# Patient Record
Sex: Male | Born: 1980 | Race: White | Hispanic: No | Marital: Single | Smoking: Never smoker
Health system: Southern US, Community
[De-identification: ages and names within clinical notes are randomized; demographics above are authoritative.]

## PROBLEM LIST (undated history)

## (undated) DIAGNOSIS — J3089 Other allergic rhinitis: Secondary | ICD-10-CM

## (undated) DIAGNOSIS — K219 Gastro-esophageal reflux disease without esophagitis: Secondary | ICD-10-CM

---

## 2014-11-20 ENCOUNTER — Emergency Department: Payer: Self-pay | Admitting: Emergency Medicine

## 2014-11-20 LAB — D-DIMER(ARMC): D-Dimer: 713 ng/ml

## 2015-09-18 ENCOUNTER — Ambulatory Visit: Payer: BLUE CROSS/BLUE SHIELD | Attending: Specialist

## 2015-09-18 DIAGNOSIS — G4733 Obstructive sleep apnea (adult) (pediatric): Secondary | ICD-10-CM | POA: Insufficient documentation

## 2017-09-15 ENCOUNTER — Ambulatory Visit
Admission: EM | Admit: 2017-09-15 | Discharge: 2017-09-15 | Disposition: A | Discharge status: Home / Self Care | Payer: BLUE CROSS/BLUE SHIELD | Attending: Family Medicine | Admitting: Family Medicine

## 2017-09-15 DIAGNOSIS — R51 Headache: Secondary | ICD-10-CM | POA: Diagnosis not present

## 2017-09-15 DIAGNOSIS — R05 Cough: Secondary | ICD-10-CM

## 2017-09-15 DIAGNOSIS — R6889 Other general symptoms and signs: Secondary | ICD-10-CM

## 2017-09-15 DIAGNOSIS — R509 Fever, unspecified: Secondary | ICD-10-CM

## 2017-09-15 HISTORY — DX: Other allergic rhinitis: J30.89

## 2017-09-15 HISTORY — DX: Gastro-esophageal reflux disease without esophagitis: K21.9

## 2017-09-15 LAB — RAPID INFLUENZA A&B ANTIGENS
Influenza A (ARMC): NEGATIVE
Influenza B (ARMC): NEGATIVE

## 2017-09-15 MED ORDER — BENZONATATE 200 MG PO CAPS: ORAL_CAPSULE | ORAL | 0 refills | Status: DC

## 2017-09-15 MED ORDER — HYDROCOD POLST-CPM POLST ER 10-8 MG/5ML PO SUER: 5.0000 mL | Freq: Two times a day (BID) | ORAL | 0 refills | Status: AC

## 2017-09-15 MED ORDER — ACETAMINOPHEN 500 MG PO TABS
1000.0000 mg | ORAL_TABLET | Freq: Once | ORAL | Status: AC
  Administered 2017-09-15: 1000 mg via ORAL

## 2017-09-15 MED ORDER — OSELTAMIVIR PHOSPHATE 75 MG PO CAPS: 75.0000 mg | ORAL_CAPSULE | Freq: Two times a day (BID) | ORAL | 0 refills | Status: AC

## 2017-09-15 NOTE — ED Provider Notes (Signed)
[facility]    CSN: [id number] Arrival date & time: 09/15/17  1906     History   Chief Complaint Chief Complaint  Patient presents with  . Influenza    HPI Curtis Steele is a 36 y.o. male.   HPI  This a 36 year old male accompanied by his wife who presents with 2 days of cough fever chills body aches and headache. He started having the fever and body aches yesterday after lunch. His  fever is currently 102.6. Pulse rate of 99 and O2 sats are 99%. Cough is nonproductive. He complains of joint pain and muscle pain. Denies receiving a flu shot . Works at [employer] thousands of packages in a day.        Past Medical History:  Diagnosis Date  . Environmental and seasonal allergies   . GERD (gastroesophageal reflux disease)     There are no active problems to display for this patient.   History reviewed. No pertinent surgical history.     Home Medications    Prior to Admission medications   Medication Sig Start Date End Date Taking? Authorizing Provider  loratadine (CLARITIN) 10 MG tablet Take 10 mg by mouth daily.   Yes [provider]  omeprazole (PRILOSEC) 20 MG capsule Take 20 mg by mouth daily.   Yes [provider]  benzonatate (TESSALON) 200 MG capsule Take one cap TID PRN cough 09/15/17   Lutricia Feiloemer, William P, PA-C  chlorpheniramine-HYDROcodone [facility](TUSSIONEX PENNKINETIC ER) 10-8 MG/5ML SUER Take 5 mLs by mouth 2 (two) times daily. 09/15/17   Lutricia Feiloemer, William P, PA-C  oseltamivir (TAMIFLU) 75 MG capsule Take 1 capsule (75 mg total) by mouth every 12 (twelve) hours. 09/15/17   Lutricia Feiloemer, William P, PA-C    Family History Family History  Problem Relation Age of Onset  . Healthy Mother   . Brain cancer Father     Social History Social History  Substance Use Topics  . Smoking status: Never Smoker  . Smokeless tobacco: Never Used  . Alcohol use Yes     Comment: social     Allergies   Patient has no known  allergies.   Review of Systems Review of Systems  Constitutional: Positive for activity change, appetite change, chills, fatigue and fever.  HENT: Positive for congestion.   Respiratory: Positive for cough.   Musculoskeletal: Positive for arthralgias and myalgias.  All other systems reviewed and are negative.    Physical Exam Triage Vital Signs ED Triage Vitals  Enc Vitals Group     BP 09/15/17 1924 124/78     Pulse Rate 09/15/17 1924 99     Resp 09/15/17 1924 20     Temp 09/15/17 1924 (!) 102.6 F (39.2 C)     Temp Source 09/15/17 1924 Oral     SpO2 09/15/17 1924 99 %     Weight 09/15/17 1925 210 lb (95.3 kg)     Height 09/15/17 1925 6\' 2"  (1.88 m)     Head Circumference --      Peak Flow --      Pain Score 09/15/17 1925 8     Pain Loc --      Pain Edu? --      Excl. in GC? --    No data found.   Updated Vital Signs BP 124/78 (BP Location: Left Arm)   Pulse 99   Temp (!) 102.6 F (39.2 C) (Oral)   Resp 20   Ht 6\' 2"  (1.88 m)  Wt 210 lb (95.3 kg)   SpO2 99%   BMI 26.96 kg/m   Visual Acuity Right Eye Distance:   Left Eye Distance:   Bilateral Distance:    Right Eye Near:   Left Eye Near:    Bilateral Near:     Physical Exam  Constitutional: He is oriented to person, place, and time. He appears well-developed and well-nourished. No distress.  HENT:  Head: Normocephalic.  Right Ear: External ear normal.  Left Ear: External ear normal.  Nose: Nose normal.  Mouth/Throat: Oropharynx is clear and moist. No oropharyngeal exudate.  Eyes: Pupils are equal, round, and reactive to light. Right eye exhibits no discharge. Left eye exhibits no discharge.  Neck: Normal range of motion. Neck supple.  Pulmonary/Chest: Effort normal and breath sounds normal.  Musculoskeletal: Normal range of motion.  Lymphadenopathy:    He has no cervical adenopathy.  Neurological: He is alert and oriented to person, place, and time.  Skin: Skin is warm and dry. He is not  diaphoretic.  Psychiatric: He has a normal mood and affect. His behavior is normal. Judgment and thought content normal.  Nursing note and vitals reviewed.    UC Treatments / Results  Labs (all labs ordered are listed, but only abnormal results are displayed) Labs Reviewed  RAPID INFLUENZA A&B ANTIGENS ([facility] ONLY)    EKG  EKG Interpretation None       Radiology No results found.  Procedures Procedures (including critical care time)  Medications Ordered in UC Medications  acetaminophen (TYLENOL) tablet 1,000 mg (1,000 mg Oral Given 09/15/17 1930)     Initial Impression / Assessment and Plan / UC Course  I have reviewed the triage vital signs and the nursing notes.  Pertinent labs & imaging results that were available during my care of the patient were reviewed by me and considered in my medical decision making (see chart for details).     Plan: 1. Test/x-ray results and diagnosis reviewed with patient 2. rx as per orders; risks, benefits, potential side effects reviewed with patient 3. Recommend supportive treatment with rest and fluids. Use of Motrin and Tylenol for body aches and fever. I've given him the option of Tamiflu or allowing it to run its course. They have opted for the Tamiflu. Also provide him with Tussionex to allow him to rest at night with a cough. Use the Tessalon Perles as necessary during the daytime. If he worsens he should go the emergency room.  4. F/u prn if symptoms worsen or don't improve   Final Clinical Impressions(s) / UC Diagnoses   Final diagnoses:  Flu-like symptoms    New Prescriptions Discharge Medication List as of 09/15/2017  8:02 PM    START taking these medications   Details  benzonatate (TESSALON) 200 MG capsule Take one cap TID PRN cough, Normal    chlorpheniramine-HYDROcodone (TUSSIONEX PENNKINETIC ER) 10-8 MG/5ML SUER Take 5 mLs by mouth 2 (two) times daily., Starting Thu 09/15/2017, Print    oseltamivir (TAMIFLU) 75  MG capsule Take 1 capsule (75 mg total) by mouth every 12 (twelve) hours., Starting Thu 09/15/2017, Normal         Controlled Substance Prescriptions [place] Controlled Substance Registry consulted? Not Applicable   Lutricia Feil, PA-C 09/15/17 2013

## 2017-09-15 NOTE — ED Triage Notes (Signed)
Pt with 2 days of cough, fever, chills, body aches and headache

## 2017-09-20 ENCOUNTER — Other Ambulatory Visit: Payer: Self-pay

## 2017-09-20 ENCOUNTER — Telehealth: Payer: Self-pay | Admitting: Emergency Medicine

## 2017-09-20 ENCOUNTER — Ambulatory Visit
Admission: EM | Admit: 2017-09-20 | Discharge: 2017-09-20 | Disposition: A | Discharge status: Home / Self Care | Payer: BLUE CROSS/BLUE SHIELD | Attending: Family Medicine | Admitting: Family Medicine

## 2017-09-20 ENCOUNTER — Encounter: Payer: Self-pay | Admitting: Emergency Medicine

## 2017-09-20 ENCOUNTER — Ambulatory Visit (INDEPENDENT_AMBULATORY_CARE_PROVIDER_SITE_OTHER): Payer: BLUE CROSS/BLUE SHIELD

## 2017-09-20 DIAGNOSIS — J40 Bronchitis, not specified as acute or chronic: Secondary | ICD-10-CM

## 2017-09-20 DIAGNOSIS — R05 Cough: Secondary | ICD-10-CM | POA: Diagnosis not present

## 2017-09-20 DIAGNOSIS — J111 Influenza due to unidentified influenza virus with other respiratory manifestations: Secondary | ICD-10-CM

## 2017-09-20 DIAGNOSIS — R69 Illness, unspecified: Principal | ICD-10-CM

## 2017-09-20 MED ORDER — ALBUTEROL SULFATE HFA 108 (90 BASE) MCG/ACT IN AERS: 1.0000 | INHALATION_SPRAY | Freq: Four times a day (QID) | RESPIRATORY_TRACT | 0 refills | Status: AC | PRN

## 2017-09-20 MED ORDER — BENZONATATE 200 MG PO CAPS: ORAL_CAPSULE | ORAL | 0 refills | Status: AC

## 2017-09-20 MED ORDER — GUAIFENESIN-CODEINE 100-10 MG/5ML PO SYRP: 5.0000 mL | ORAL_SOLUTION | Freq: Three times a day (TID) | ORAL | 0 refills | Status: AC | PRN

## 2017-09-20 MED ORDER — PREDNISONE 20 MG PO TABS: ORAL_TABLET | ORAL | 0 refills | Status: AC

## 2017-09-20 NOTE — Telephone Encounter (Signed)
Patient's wife called stating that patient was seen on 09/15/17 and treated for flu. Patient has taken all of the Tamiflu, but is still having cough and fever. Wife wanted to know what to do. I advised that patient would need to come in for re-evaluation. Patient's wife voiced understanding and will try to bring patient back in tonight around 6pm.

## 2017-09-20 NOTE — ED Triage Notes (Signed)
Patient in today c/o fever (can't get below 100) and cough. Patient was seen here on 09/15/17 and treated for flu. Patient has finished Tamiflu.

## 2017-09-20 NOTE — ED Provider Notes (Signed)
[facility]    CSN: [id number] Arrival date & time: 09/20/17  1739     History   Chief Complaint Chief Complaint  Patient presents with  . Fever  . Cough    HPI Curtis Steele is a 36 y.o. male.   HPI  Is a [age] year old male who is accompanied by his wife. He was seen by myself on 09/14/2017 and diagnosed with a flulike illness. He was given Tamiflu as well as Tessalon pearls and Tussionex. He states that he has felt better but he continues to have fever and cough. Patient does look improved. Today is 99.2 pulse rate of 87 blood pressure 123/70 O2 sats are 97% on room air. His not return to work yet because his fever is still persistent. He has been coughing mostly at nighttime but throughout the day as well. He states that the Tussionex seems to have given him a headache although it does help him sleep better.       Past Medical History:  Diagnosis Date  . Environmental and seasonal allergies   . GERD (gastroesophageal reflux disease)     There are no active problems to display for this patient.   History reviewed. No pertinent surgical history.     Home Medications    Prior to Admission medications   Medication Sig Start Date End Date Taking? Authorizing Provider  chlorpheniramine-HYDROcodone (TUSSIONEX PENNKINETIC ER) 10-8 MG/5ML SUER Take 5 mLs by mouth 2 (two) times daily. 09/15/17  Yes Lutricia Feiloemer, Zhion Pevehouse P, PA-C  loratadine (CLARITIN) 10 MG tablet Take 10 mg by mouth daily.   Yes [provider]  omeprazole (PRILOSEC) 20 MG capsule Take 20 mg by mouth daily.   Yes [provider]  albuterol (PROVENTIL HFA;VENTOLIN HFA) 108 (90 Base) MCG/ACT inhaler Inhale 1-2 puffs every 6 (six) hours as needed into the lungs for wheezing or shortness of breath. Use with spacer 09/20/17   Lutricia Feiloemer, Burna Atlas P, PA-C  benzonatate (TESSALON) 200 MG capsule Take one cap TID PRN cough 09/20/17   Ovid Curdoemer, Oleva Koo P, PA-C  guaiFENesin-codeine (CHERATUSSIN AC)  100-10 MG/5ML syrup Take 5 mLs 3 (three) times daily as needed by mouth for cough. 09/20/17   Lutricia Feiloemer, Kahmari Koller P, PA-C  oseltamivir (TAMIFLU) 75 MG capsule Take 1 capsule (75 mg total) by mouth every 12 (twelve) hours. 09/15/17   Lutricia Feiloemer, Nephtali Docken P, PA-C  predniSONE (DELTASONE) 20 MG tablet Take 2 tablets (40 mg) daily by mouth 09/20/17   Lutricia Feiloemer, Abriana Saltos P, PA-C    Family History Family History  Problem Relation Age of Onset  . Healthy Mother   . Brain cancer Father     Social History Social History   Tobacco Use  . Smoking status: Never Smoker  . Smokeless tobacco: Never Used  Substance Use Topics  . Alcohol use: Yes    Comment: social  . Drug use: No     Allergies   Patient has no known allergies.   Review of Systems Review of Systems  Constitutional: Positive for activity change, chills, fatigue and fever.  HENT: Positive for congestion.   Respiratory: Positive for cough and shortness of breath.   All other systems reviewed and are negative.    Physical Exam Triage Vital Signs ED Triage Vitals [09/20/17 1748]  Enc Vitals Group     BP 123/70     Pulse Rate 87     Resp 16     Temp 99.2 F (37.3 C)     Temp Source Oral  SpO2 97 %     Weight 210 lb (95.3 kg)     Height 6\' 2"  (1.88 m)     Head Circumference      Peak Flow      Pain Score 0     Pain Loc      Pain Edu?      Excl. in GC?    No data found.  Updated Vital Signs BP 123/70 (BP Location: Left Arm)   Pulse 87   Temp 99.2 F (37.3 C) (Oral)   Resp 16   Ht 6\' 2"  (1.88 m)   Wt 210 lb (95.3 kg)   SpO2 97%   BMI 26.96 kg/m   Visual Acuity Right Eye Distance:   Left Eye Distance:   Bilateral Distance:    Right Eye Near:   Left Eye Near:    Bilateral Near:     Physical Exam  Constitutional: He is oriented to person, place, and time. He appears well-developed and well-nourished. No distress.  HENT:  Head: Normocephalic.  Eyes: Pupils are equal, round, and reactive to light.  Neck:  Normal range of motion.  Pulmonary/Chest: He has wheezes.   coughs consistently while while examination of the lungs. He does have  wheezes at end expiratory mostly in the left lung.  Musculoskeletal: Normal range of motion.  Neurological: He is alert and oriented to person, place, and time.  Skin: Skin is warm and dry. He is not diaphoretic.  Psychiatric: He has a normal mood and affect. His behavior is normal. Judgment and thought content normal.  Nursing note and vitals reviewed.    UC Treatments / Results  Labs (all labs ordered are listed, but only abnormal results are displayed) Labs Reviewed - No data to display  EKG  EKG Interpretation None       Radiology Dg Chest 2 View  Result Date: 09/20/2017 CLINICAL DATA:  36 y/o  M; cough with recent flu. EXAM: CHEST  2 VIEW COMPARISON:  11/20/2014 chest radiograph FINDINGS: Normal cardiac silhouette. Bronchitic changes greatest in left mid lung zone. No consolidation. No pleural effusion or pneumothorax. Bones are unremarkable. IMPRESSION: Bronchitic changes greatest in left mid lung zone. No focal consolidation. Electronically Signed   By: Mitzi HansenLance  Furusawa-Stratton M.D.   On: 09/20/2017 19:14    Procedures Procedures (including critical care time)  Medications Ordered in UC Medications - No data to display   Initial Impression / Assessment and Plan / UC Course  I have reviewed the triage vital signs and the nursing notes.  Pertinent labs & imaging results that were available during my care of the patient were reviewed by me and considered in my medical decision making (see chart for details).     Plan: 1. Test/x-ray results and diagnosis reviewed with patient 2. rx as per orders; risks, benefits, potential side effects reviewed with patient 3. Recommend supportive treatment with albuterol inhaler as necessary for shortness of breath. I will also change his Tussionex over to hydrocodone guaifenesin combination. I think  Tussionex may be causing his headache. Give him a different preparation. Also suggested consideration of prednisone if he is unable to control his coughing adequately. They will hold onto it and decide as his symptoms dictate. He should follow-up with his primary care physician next week. He does look improved and expected to continue. 4. F/u prn if symptoms worsen or don't improve   Final Clinical Impressions(s) / UC Diagnoses   Final diagnoses:  Influenza-like illness  Bronchitis  ED Discharge Orders        Ordered    guaiFENesin-codeine [facility]) 100-10 MG/5ML syrup  3 times daily PRN     09/20/17 1934    albuterol (PROVENTIL HFA;VENTOLIN HFA) 108 (90 Base) MCG/ACT inhaler  Every 6 hours PRN    Comments:  Provide spacer and instructions to patient   09/20/17 1934    predniSONE (DELTASONE) 20 MG tablet     09/20/17 1934    benzonatate (TESSALON) 200 MG capsule     09/20/17 1941       Controlled Substance Prescriptions [place] Controlled Substance Registry consulted? Not Applicable   Lutricia Feil, PA-C 09/20/17 2038

## 2018-11-24 ENCOUNTER — Ambulatory Visit: Payer: BLUE CROSS/BLUE SHIELD | Admitting: Podiatry

## 2018-11-24 ENCOUNTER — Ambulatory Visit (INDEPENDENT_AMBULATORY_CARE_PROVIDER_SITE_OTHER): Payer: BLUE CROSS/BLUE SHIELD

## 2018-11-24 ENCOUNTER — Encounter: Payer: Self-pay | Admitting: Podiatry

## 2018-11-24 VITALS — BP 132/85 | HR 66

## 2018-11-24 DIAGNOSIS — M722 Plantar fascial fibromatosis: Secondary | ICD-10-CM

## 2018-11-24 MED ORDER — MELOXICAM 15 MG PO TABS: 15.0000 mg | ORAL_TABLET | Freq: Every day | ORAL | 1 refills | Status: AC

## 2018-11-24 MED ORDER — METHYLPREDNISOLONE 4 MG PO TBPK: ORAL_TABLET | ORAL | 0 refills | Status: AC

## 2018-11-26 NOTE — Progress Notes (Signed)
   Subjective: 38 year old Steele presenting today as a new patient with a chief complaint of a flare up of bilateral plantar fasciitis that he was diagnosed with three years ago. He states he received an injection from Dr. Ether Griffins three years ago which provided no relief. Walking and standing for prolonged periods of time increases the pain. He has been using his old orthotics with minimal relief. Patient is here for further evaluation and treatment.   Past Medical History:  Diagnosis Date  . Environmental and seasonal allergies   . GERD (gastroesophageal reflux disease)      Objective: Physical Exam General: The patient is alert and oriented x3 in no acute distress.  Dermatology: Skin is warm, dry and supple bilateral lower extremities. Negative for open lesions or macerations bilateral.   Vascular: Dorsalis Pedis and Posterior Tibial pulses palpable bilateral.  Capillary fill time is immediate to all digits.  Neurological: Epicritic and protective threshold intact bilateral.   Musculoskeletal: Tenderness to palpation to the plantar aspect of the bilateral heels along the plantar fascia. All other joints range of motion within normal limits bilateral. Strength 5/5 in all groups bilateral.   Radiographic exam: Normal osseous mineralization. Joint spaces preserved. No fracture/dislocation/boney destruction. No other soft tissue abnormalities or radiopaque foreign bodies.   Assessment: 1. plantar fasciitis bilateral feet  Plan of Care:  1. Patient evaluated. Xrays reviewed.   2. Declined injection.  3. Rx for Medrol Dose Pak placed 4. Rx for Meloxicam ordered for patient. 5. Appointment with Raiford Noble, Pedorthist, for custom molded orthotics. Continue using old orthotics from [facility].  6. Instructed patient regarding therapies and modalities at home to alleviate symptoms.  7. Return to clinic in 8 weeks.    Felecia Shelling, DPM [facility]  Dr. Felecia Shelling,  DPM    [address] [place], [place] [address]                Office [phone]  Fax [phone]

## 2018-12-06 ENCOUNTER — Ambulatory Visit (INDEPENDENT_AMBULATORY_CARE_PROVIDER_SITE_OTHER): Payer: Self-pay | Admitting: Orthotics

## 2018-12-06 DIAGNOSIS — M722 Plantar fascial fibromatosis: Secondary | ICD-10-CM

## 2018-12-06 NOTE — Progress Notes (Signed)

## 2018-12-27 ENCOUNTER — Ambulatory Visit: Payer: BLUE CROSS/BLUE SHIELD | Admitting: Orthotics

## 2018-12-27 DIAGNOSIS — M722 Plantar fascial fibromatosis: Secondary | ICD-10-CM

## 2018-12-27 NOTE — Progress Notes (Signed)
Patient came in today to pick up custom made foot orthotics.  The goals were accomplished and the patient reported no dissatisfaction with said orthotics.  Patient was advised of breakin period and how to report any issues. 

## 2019-04-25 ENCOUNTER — Telehealth: Payer: Self-pay | Admitting: Genetic Counselor

## 2019-04-25 ENCOUNTER — Encounter: Payer: Self-pay | Admitting: Genetic Counselor

## 2019-04-25 NOTE — Telephone Encounter (Signed)
A genetic counseling appt has been scheduled for the pt to see Roma Kayser on 7/20 at 2pm. Lft the appt date and time on the pt's vm. Letter mailed.

## 2019-04-30 ENCOUNTER — Encounter: Payer: Self-pay | Admitting: Licensed Clinical Social Worker

## 2019-05-19 IMAGING — CR DG CHEST 2V
2 series · 2 of 2 positions shown · non-contrast
Comparison: 11/20/2014 chest radiograph

CLINICAL DATA: 35 y/o  M; cough with recent flu.

EXAM:
CHEST  2 VIEW

[chest pa]
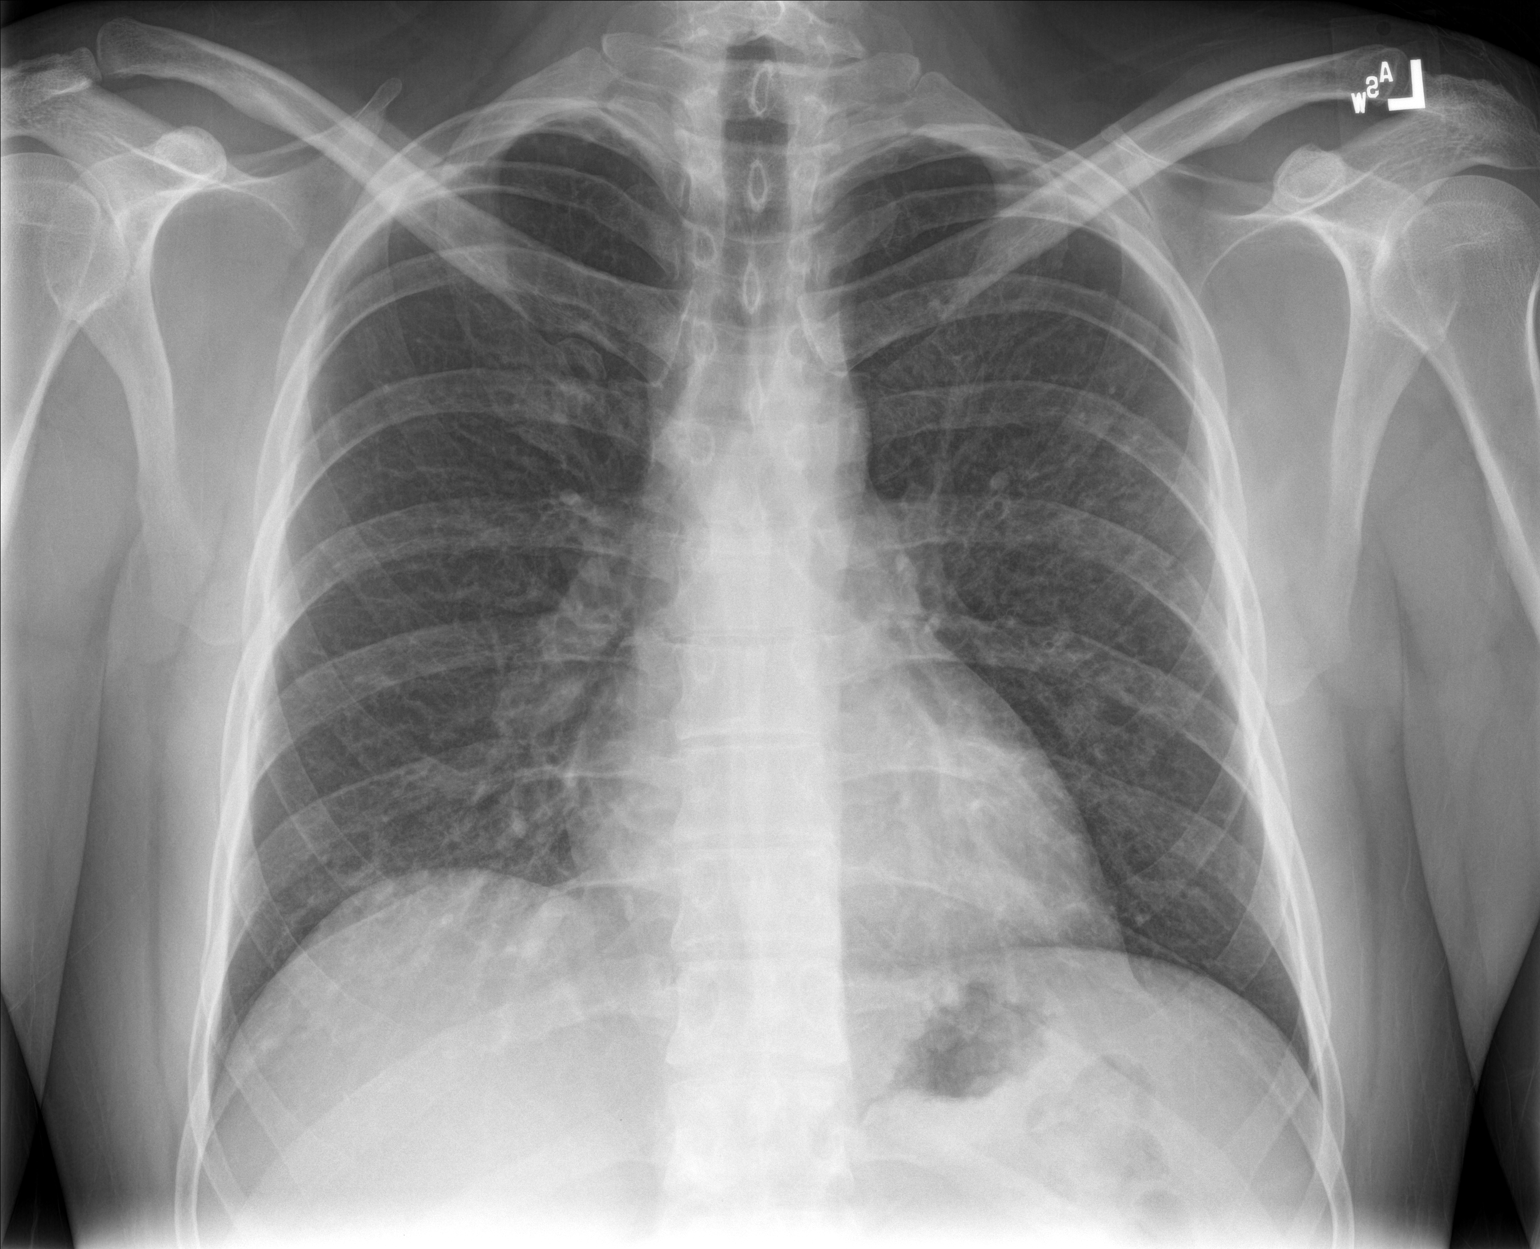

[chest lat]
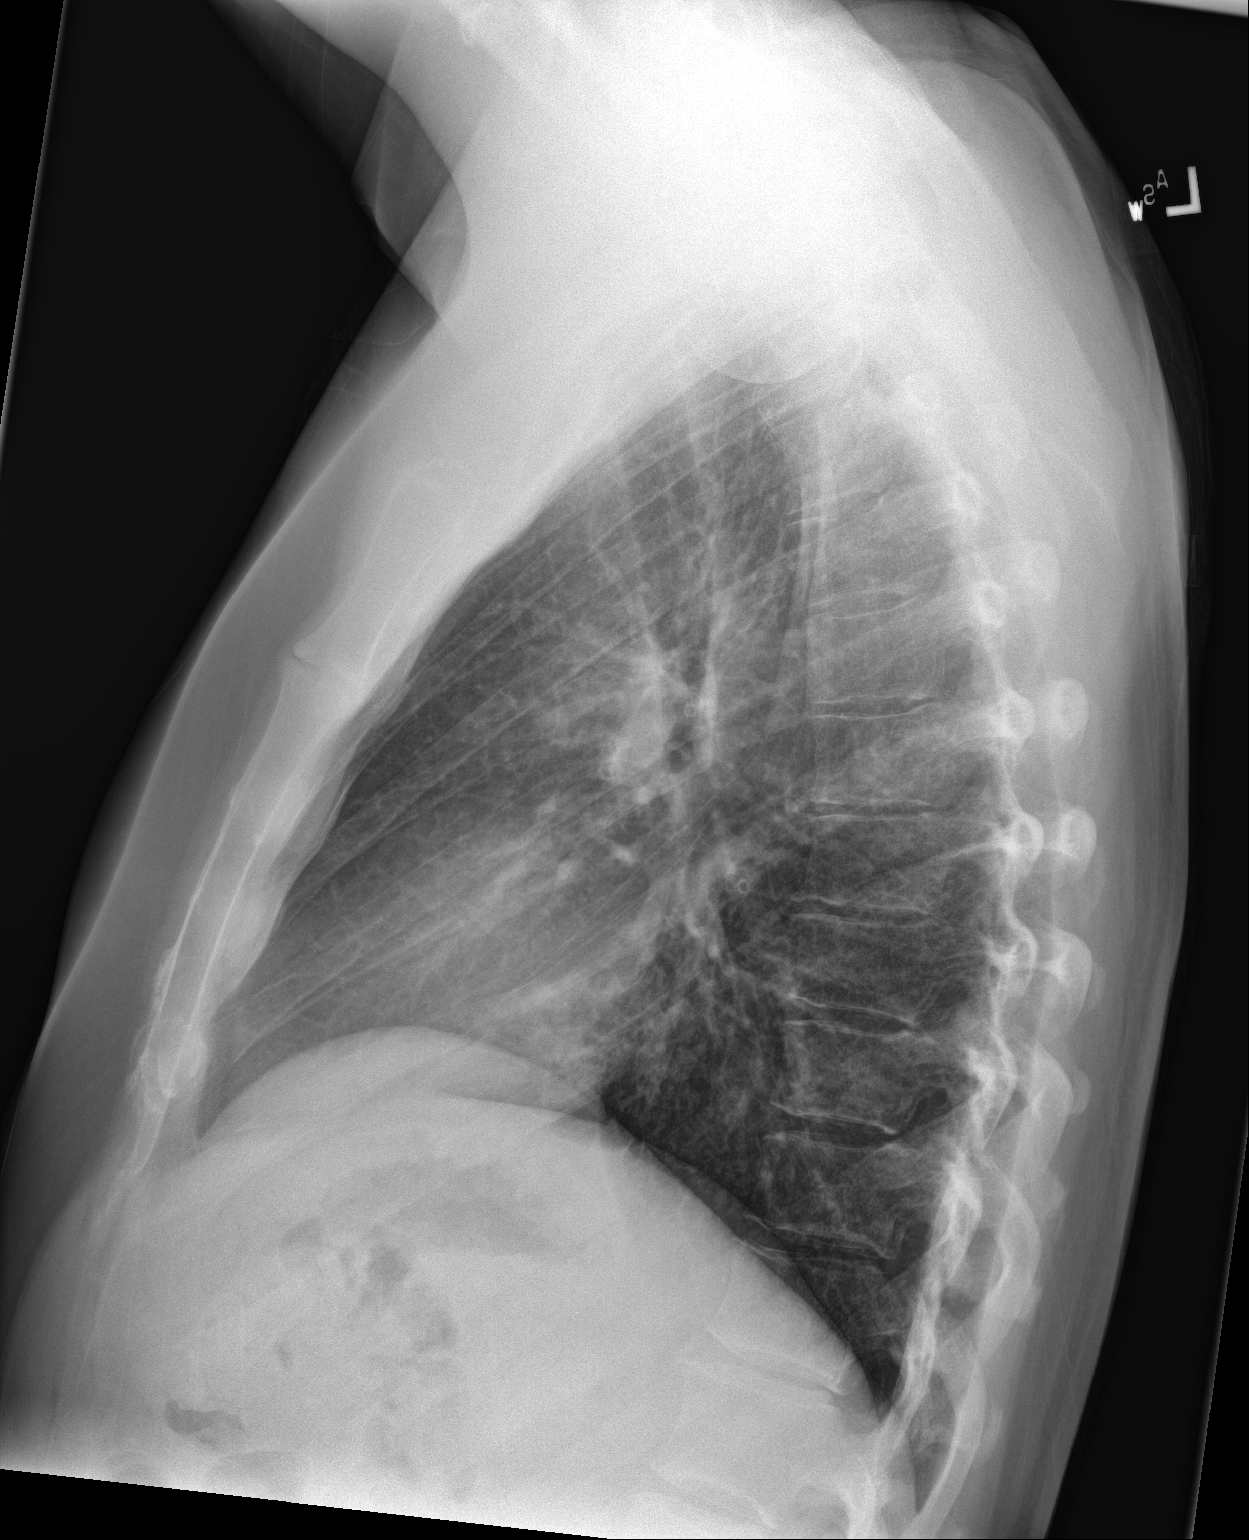

[2 of 2 positions shown; findings below may reference images not displayed]

FINDINGS: Normal cardiac silhouette. Bronchitic changes greatest in left mid
lung zone. No consolidation. No pleural effusion or pneumothorax.
Bones are unremarkable.
IMPRESSION: Bronchitic changes greatest in left mid lung zone. No focal
consolidation.

By: Tyrell Saraiva M.D.

## 2019-05-24 ENCOUNTER — Inpatient Hospital Stay: Payer: BC Managed Care – PPO | Admitting: Licensed Clinical Social Worker

## 2019-05-24 ENCOUNTER — Inpatient Hospital Stay: Payer: BC Managed Care – PPO

## 2019-06-04 ENCOUNTER — Encounter: Payer: BLUE CROSS/BLUE SHIELD | Admitting: Genetic Counselor

## 2022-12-23 ENCOUNTER — Ambulatory Visit
Admission: EM | Admit: 2022-12-23 | Discharge: 2022-12-23 | Disposition: A | Discharge status: Home / Self Care | Payer: BC Managed Care – PPO | Attending: Emergency Medicine | Admitting: Emergency Medicine

## 2022-12-23 ENCOUNTER — Encounter: Payer: Self-pay | Admitting: Emergency Medicine

## 2022-12-23 DIAGNOSIS — H60393 Other infective otitis externa, bilateral: Secondary | ICD-10-CM

## 2022-12-23 MED ORDER — CIPROFLOXACIN-DEXAMETHASONE 0.3-0.1 % OT SUSP: 4.0000 [drp] | Freq: Two times a day (BID) | OTIC | 0 refills | Status: AC

## 2022-12-23 NOTE — Discharge Instructions (Addendum)
Today you are being treated for an infection of the ear canal   Take 4 drops of ciprodex to both ears twice daily for 7 days, ideally should begin to see improvement in about 48 hours  You may use Tylenol or ibuprofen for management of discomfort  May hold warm compresses to the ear for additional comfort  Please not attempted any ear cleaning or object or fluid placement into the ear canal to prevent further irritation   Upcoming doctor's appointment on Tuesday, if no improvement seen it can be reevaluated at that time however if symptoms worsen you may return to urgent care

## 2022-12-23 NOTE — ED Provider Notes (Signed)
[facility]    CSN: [id number] Arrival date & time: 12/23/22  1658      History   Chief Complaint Chief Complaint  Patient presents with   Ear Drainage     HPI TAIKI BUCKWALTER is a 42 y.o. male.   Patient presents with bilateral ear fullness, drainage and decreased hearing to the right side present for 9 days.  Has attempted use of a prescription eardrop, unable to recall name and heating pad which has been ineffective.  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  Denies pain, congestion, fevers.   Past Medical History:  Diagnosis Date   Environmental and seasonal allergies    GERD (gastroesophageal reflux disease)     There are no problems to display for this patient.   History reviewed. No pertinent surgical history.     Home Medications    Prior to Admission medications   Medication Sig Start Date End Date Taking? Authorizing Provider  albuterol (PROVENTIL HFA;VENTOLIN HFA) 108 (90 Base) MCG/ACT inhaler Inhale 1-2 puffs every 6 (six) hours as needed into the lungs for wheezing or shortness of breath. Use with spacer 09/20/17   Lorin Picket, PA-C  benzonatate (TESSALON) 200 MG capsule Take one cap TID PRN cough Patient not taking: Reported on 11/24/2018 09/20/17   Lorin Picket, PA-C  chlorpheniramine-HYDROcodone [facility] PENNKINETIC ER) 10-8 MG/5ML SUER Take 5 mLs by mouth 2 (two) times daily. Patient not taking: Reported on 11/24/2018 09/15/17   Lorin Picket, PA-C  guaiFENesin-codeine (CHERATUSSIN AC) 100-10 MG/5ML syrup Take 5 mLs 3 (three) times daily as needed by mouth for cough. Patient not taking: Reported on 11/24/2018 09/20/17   Lorin Picket, PA-C  loratadine (CLARITIN) 10 MG tablet Take 10 mg by mouth daily.    [provider]  methylPREDNISolone (MEDROL DOSEPAK) 4 MG TBPK tablet 6 day dose pack - take as directed 11/24/18   Edrick Kins, DPM  omeprazole (PRILOSEC) 20 MG capsule Take 20 mg by mouth daily.    [provider]  oseltamivir (TAMIFLU) 75 MG capsule Take 1 capsule (75 mg total) by mouth every 12 (twelve) hours. 09/15/17   Lorin Picket, PA-C  predniSONE (DELTASONE) 20 MG tablet Take 2 tablets (40 mg) daily by mouth Patient not taking: Reported on 11/24/2018 09/20/17   Lorin Picket, PA-C    Family History Family History  Problem Relation Age of Onset   Healthy Mother    Brain cancer Father     Social History Social History   Tobacco Use   Smoking status: Never   Smokeless tobacco: Never  Vaping Use   Vaping Use: Never used  Substance Use Topics   Alcohol use: Yes    Comment: social   Drug use: No     Allergies   Patient has no known allergies.   Review of Systems Review of Systems   Physical Exam Triage Vital Signs ED Triage Vitals  Enc Vitals Group     BP 12/23/22 1709 119/77     Pulse Rate 12/23/22 1709 75     Resp 12/23/22 1709 18     Temp 12/23/22 1709 98.4 F (36.9 C)     Temp Source 12/23/22 1709 Oral     SpO2 12/23/22 1709 96 %     Weight --      Height --      Head Circumference --  Peak Flow --      Pain Score 12/23/22 1708 0     Pain Loc --      Pain Edu? --      Excl. in [place]? --    No data found.  Updated Vital Signs BP 119/77 (BP Location: Right Arm)   Pulse 75   Temp 98.4 F (36.9 C) (Oral)   Resp 18   SpO2 96%   Visual Acuity Right Eye Distance:   Left Eye Distance:   Bilateral Distance:    Right Eye Near:   Left Eye Near:    Bilateral Near:     Physical Exam Constitutional:      Appearance: Normal appearance.  HENT:     Ears:     Comments: Copious Juliano Mceachin to yellow drainage present to the bilateral ear canals without erythema or tenderness to the right tympanic membrane, perforation present on the left, tympanostomy tube in place on the right side. Eyes:     Extraocular Movements: Extraocular movements intact.  Pulmonary:      Effort: Pulmonary effort is normal.  Neurological:     Mental Status: He is alert and oriented to person, place, and time.      UC Treatments / Results  Labs (all labs ordered are listed, but only abnormal results are displayed) Labs Reviewed - No data to display  EKG   Radiology No results found.  Procedures Procedures (including critical care time)  Medications Ordered in UC Medications - No data to display  Initial Impression / Assessment and Plan / UC Course  I have reviewed the triage vital signs and the nursing notes.  Pertinent labs & imaging results that were available during my care of the patient were reviewed by me and considered in my medical decision making (see chart for details).  Infective otitis externa of both ears  Ciprodex prescribed, discussed administration is against ear cleaning or object placement within the canal, if pain begins may use over-the-counter analgesics for management as well as warm compresses to the external ear, has upcoming appointment with primary doctor in 5 days, recommended reevaluation of ears at this time, may follow-up with urgent care prior to appointment for any worsening symptoms Final Clinical Impressions(s) / UC Diagnoses   Final diagnoses:  None   Discharge Instructions   None    ED Prescriptions   None    PDMP not reviewed this encounter.   Hans Eden, [place] 12/23/22 [phone]

## 2022-12-23 NOTE — ED Triage Notes (Signed)
Pt presents with bilateral ear drainage and ear fullness x 9 days.
